# Patient Record
Sex: Male | Born: 2000 | Race: Black or African American | Hispanic: No | Marital: Single | Smoking: Never smoker
Health system: Southern US, Community
[De-identification: ages and names within clinical notes are randomized; demographics above are authoritative.]

## PROBLEM LIST (undated history)

## (undated) DIAGNOSIS — R61 Generalized hyperhidrosis: Secondary | ICD-10-CM

---
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
## 2020-10-18 ENCOUNTER — Encounter: Payer: Self-pay | Admitting: Family Medicine

## 2020-10-18 ENCOUNTER — Other Ambulatory Visit: Payer: Self-pay

## 2020-10-18 ENCOUNTER — Ambulatory Visit: Payer: Commercial Managed Care - PPO | Admitting: Family Medicine

## 2020-10-18 VITALS — BP 131/82 | HR 77 | Temp 97.8°F | Ht 71.0 in | Wt 150.0 lb

## 2020-10-18 DIAGNOSIS — R040 Epistaxis: Secondary | ICD-10-CM

## 2020-10-18 LAB — BASIC METABOLIC PANEL
BUN: 13 mg/dL (ref 7–20)
CO2: 32 mmol/L (ref 20–32)
Calcium: 10.3 mg/dL (ref 8.9–10.4)
Chloride: 101 mmol/L (ref 98–110)
Creat: 1.09 mg/dL (ref 0.60–1.26)
Glucose, Bld: 80 mg/dL (ref 65–139)
Potassium: 4.9 mmol/L (ref 3.8–5.1)
Sodium: 140 mmol/L (ref 135–146)

## 2020-10-18 LAB — HEPATIC FUNCTION PANEL
AG Ratio: 1.5 (calc) (ref 1.0–2.5)
ALT: 6 U/L — ABNORMAL LOW (ref 8–46)
AST: 14 U/L (ref 12–32)
Albumin: 4.7 g/dL (ref 3.6–5.1)
Alkaline phosphatase (APISO): 90 U/L (ref 46–169)
Bilirubin, Direct: 0.1 mg/dL (ref 0.0–0.2)
Globulin: 3.1 g/dL (calc) (ref 2.1–3.5)
Indirect Bilirubin: 0.3 mg/dL (calc) (ref 0.2–1.1)
Total Bilirubin: 0.4 mg/dL (ref 0.2–1.1)
Total Protein: 7.8 g/dL (ref 6.3–8.2)

## 2020-10-18 LAB — CBC WITH DIFFERENTIAL/PLATELET
Absolute Monocytes: 278 cells/uL (ref 200–950)
Basophils Absolute: 10 cells/uL (ref 0–200)
Basophils Relative: 0.2 %
Eosinophils Absolute: 72 cells/uL (ref 15–500)
Eosinophils Relative: 1.5 %
HCT: 42.7 % (ref 38.5–50.0)
Hemoglobin: 14.3 g/dL (ref 13.2–17.1)
Lymphs Abs: 1694 cells/uL (ref 850–3900)
MCH: 29.7 pg (ref 27.0–33.0)
MCHC: 33.5 g/dL (ref 32.0–36.0)
MCV: 88.8 fL (ref 80.0–100.0)
MPV: 9.3 fL (ref 7.5–12.5)
Monocytes Relative: 5.8 %
Neutro Abs: 2746 cells/uL (ref 1500–7800)
Neutrophils Relative %: 57.2 %
Platelets: 393 10*3/uL (ref 140–400)
RBC: 4.81 10*6/uL (ref 4.20–5.80)
RDW: 12.5 % (ref 11.0–15.0)
Total Lymphocyte: 35.3 %
WBC: 4.8 10*3/uL (ref 3.8–10.8)

## 2020-10-18 LAB — PROTIME-INR
INR: 1.2 — ABNORMAL HIGH
Prothrombin Time: 11.9 s — ABNORMAL HIGH (ref 9.0–11.5)

## 2020-10-18 MED ORDER — MUPIROCIN 2 % EX OINT: TOPICAL_OINTMENT | CUTANEOUS | 0 refills | Status: DC

## 2020-10-18 NOTE — Assessment & Plan Note (Signed)
Check cbc, bmp, hepatic panel and PT/INR.  Turbinates are inflamed, adding mupirocin bid x7 days.  Continue humidifier and nasal saline.  If continues and labs are normal we discussed referral to ENT.

## 2020-10-18 NOTE — Progress Notes (Signed)
Terrance Gonzalez - 20 y.o. male MRN [id number]  Date of birth: November 09, 2000  Subjective Chief Complaint  Patient presents with  . Epistaxis    HPI Buck Mcaffee is a 20 y.o. male here today with complaint of recurrent epistaxis.  Started having recurrent nosebleeds a couple of years ago.  He reports that now he is having every other week.  Worse during colder months.  Sometimes large amount of blood and just streaked in mucus at other times.  Denies nasal pain or headaches.  He has not noted any bleeding gums or easy bruising.  No family history of bleeding disorder.  He is using a humidifier and uses nasal saline intermittently.   ROS:  A comprehensive ROS was completed and negative except as noted per HPI  No Known Allergies  Past Medical History:  Diagnosis Date  . 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 No pertinent surgical history.  Social History   Socioeconomic History  . Marital status: Single    Spouse name: Not on file  . Number of children: Not on file  . Years of education: Not on file  . Highest education level: Not on file  Occupational History  . Not on file  Tobacco Use  . Smoking status: Never Smoker  . Smokeless tobacco: Never Used  Substance and Sexual Activity  . Alcohol use: Not on file  . Drug use: Not on file  . Sexual activity: Not on file  Other Topics Concern  . Not on file  Social History Narrative  . Not on file   Social Determinants of Health   Financial Resource Strain: Not on file  Food Insecurity: Not on file  Transportation Needs: Not on file  Physical Activity: Not on file  Stress: Not on file  Social Connections: Not on file    History reviewed. No pertinent family history.  Health Maintenance  Topic Date Due  . Hepatitis C Screening  Never done  . HPV VACCINES (1 - Male 2-dose series) Never done  . HIV Screening  Never done  . INFLUENZA VACCINE  Never done  . COVID-19 Vaccine (3 - Booster for Pfizer series) 02/13/2021   . TETANUS/TDAP  06/09/2021     ----------------------------------------------------------------------------------------------------------------------------------------------------------------------------------------------------------------- Physical Exam BP 131/82 (BP Location: Left Arm, Patient Position: Sitting, Cuff Size: Normal)   Pulse 77   Temp 97.8 F (36.6 C)   Ht 5\' 11"  (1.803 m)   Wt 150 lb (68 kg)   SpO2 100%   BMI 20.92 kg/m   Physical Exam Constitutional:      Appearance: Normal appearance.  HENT:     Nose:     Comments: Turbinates inflamed and erythematous. No mucosal thinning noted.  Eyes:     General: No scleral icterus. Cardiovascular:     Rate and Rhythm: Normal rate and regular rhythm.  Pulmonary:     Effort: Pulmonary effort is normal.     Breath sounds: Normal breath sounds.  Neurological:     General: No focal deficit present.     Mental Status: He is alert.  Psychiatric:        Mood and Affect: Mood normal.        Behavior: Behavior normal.     ------------------------------------------------------------------------------------------------------------------------------------------------------------------------------------------------------------------- Assessment and Plan  Recurrent epistaxis Check cbc, bmp, hepatic panel and PT/INR.  Turbinates are inflamed, adding mupirocin bid x7 days.  Continue humidifier and nasal saline.  If continues and labs are normal we discussed referral to ENT.   Meds ordered this  encounter  Medications  . mupirocin ointment (BACTROBAN) 2 %    Sig: Apply to inside of nares twice per day x7 days.    Dispense:  22 g    Refill:  0    No follow-ups on file.    This visit occurred during the SARS-CoV-2 public health emergency.  Safety protocols were in place, including screening questions prior to the visit, additional usage of staff PPE, and extensive cleaning of exam room while observing appropriate contact  time as indicated for disinfecting solutions.

## 2020-10-18 NOTE — Patient Instructions (Signed)
Nosebleed, Adult A nosebleed is when blood comes out of the nose. Nosebleeds are common and can be caused by many things. They are usually not a sign of a serious medical problem. Follow these instructions at home: When you have a nosebleed:  Sit down.  Tilt your head a little forward.  Follow these steps: 1. Pinch your nose with a clean towel or tissue. 2. Keep pinching your nose for 5 minutes. Do not let go. 3. After 5 minutes, let go of your nose. 4. If there is still bleeding, do these steps again. Keep doing these steps until the bleeding stops.  Do not put tissues or other things in your nose to stop the bleeding.  Avoid lying down or putting your head back.  Use a nose spray decongestant as told by your doctor.   After a nosebleed:  Try not to blow your nose or sniffle for several hours.  Try not to strain, lift, or bend at the waist for several days.  Aspirin and blood-thinning medicines make bleeding more likely. If you take these medicines: ? Ask your doctor if you should stop taking them or if you should change how much you take. ? Do not stop taking the medicine unless your doctor tells you to.  If your nosebleed was caused by dryness, use over-the-counter saline nasal spray or gel and humidifier as told by your doctor. This will keep the inside of your nose moist and allow it to heal. If you need to use one of these products: ? Choose one that is water-soluble. ? Use only as much as you need and use it only as often as needed. ? Do not lie down right away after you use it.  If you get nosebleeds often, talk with your doctor about treatments. These may include: ? Nasal cautery. A chemical swab or electrical device is used to lightly burn tiny blood vessels inside the nose. This helps stop or prevent nosebleeds. ? Nasal packing. A gauze or other material is placed in the nose to keep constant pressure on the bleeding area. Contact a doctor if:  You have a  fever.  You get nosebleeds often.  You are getting nosebleeds more often than usual.  You bruise very easily.  You have something stuck in your nose.  You have bleeding in your mouth.  You vomit or cough up brown material.  You get a nosebleed after you start a new medicine. Get help right away if:  You have a nosebleed after you fall or hurt your head.  Your nosebleed does not go away after 20 minutes.  You feel dizzy or weak.  You have unusual bleeding from other parts of your body.  You have unusual bruising on other parts of your body.  You get sweaty.  You vomit blood. Summary  Nosebleeds are common. They are usually not a sign of a serious medical problem.  When you have a nosebleed, sit down and tilt your head a little forward. Pinch your nose with a clean tissue for 5 minutes.  Use saline spray or saline gel and a humidifier as told by your doctor.  Get help right away if your nosebleed does not go away after 20 minutes. This information is not intended to replace advice given to you by your health care provider. Make sure you discuss any questions you have with your health care provider. Document Revised: 05/06/2019 Document Reviewed: 05/06/2019 Elsevier Patient Education  2021 [employer]  

## 2021-08-28 ENCOUNTER — Encounter: Payer: Self-pay | Admitting: Family Medicine

## 2021-08-28 ENCOUNTER — Telehealth: Payer: Self-pay

## 2021-08-28 NOTE — Telephone Encounter (Signed)
Patient's mother, Jasmine December, called the triage line to discuss the patient who tested positive for COVID on 08/26/2021 using at home test. He started experiencing symptoms on 08/25/2021 with nausea and vomiting. He has been experiencing  fevers, headaches, sore throat, and cough. Explained to mom the 5 day quarantine rules and treatment options for the patient. Using OTC meds (Dayquil and Nyquil), wearing a mask around the house, and returning to work procedures. She gave verbal understanding and stated the patient was feeling better.

## 2021-09-25 ENCOUNTER — Encounter: Payer: Self-pay | Admitting: Family Medicine

## 2021-09-25 ENCOUNTER — Ambulatory Visit: Payer: 59 | Admitting: Family Medicine

## 2021-09-25 ENCOUNTER — Other Ambulatory Visit: Payer: Self-pay

## 2021-09-25 DIAGNOSIS — R040 Epistaxis: Secondary | ICD-10-CM | POA: Diagnosis not present

## 2021-09-25 MED ORDER — SALINE SPRAY 0.65 % NA SOLN: 1.0000 | NASAL | 3 refills | Status: AC | PRN

## 2021-09-25 MED ORDER — MUPIROCIN 2 % EX OINT: TOPICAL_OINTMENT | CUTANEOUS | 0 refills | Status: AC

## 2021-09-25 NOTE — Assessment & Plan Note (Signed)
Adding mupirocin back on x7 days.  Continue nasal saline.  If this continues to be a recurrent issue will refer to ENT. ?

## 2021-09-25 NOTE — Patient Instructions (Signed)
Nosebleed, Adult A nosebleed is when blood comes out of the nose. Nosebleeds are common. Usually, they are not a sign of a serious condition. Nosebleeds can happen if a blood vessel in your nose starts to bleed or if the lining of your nose (mucous membrane) cracks. They are commonly caused by: Allergies. Colds. Picking your nose. Blowing your nose too hard. An injury from sticking an object into your nose or getting hit in the nose. Dry or cold air. Less common causes of nosebleeds include: Toxic fumes. Something abnormal in the nose or in the air-filled spaces in the bones of the face (sinuses). Growths in the nose, such as polyps. Blood thinners or conditions that cause blood to clot slowly. Certain illnesses or procedures that irritate or dry out the nasal passages. Follow these instructions at home: When you have a nosebleed:  Sit down and tilt your head slightly forward. Use a clean towel or tissue to pinch your nostrils under the bony part of your nose. After 5 minutes, let go of your nose and see if bleeding starts again. Do not release pressure before that time. If there is still bleeding, repeat the pinching and holding for 5 minutes or until the bleeding stops. Do not place tissues or gauze in the nose to stop the bleeding. Avoid lying down and avoid tilting your head backward. That may make blood collect in the throat and cause gagging or coughing. Use a nasal spray decongestant to help with a nosebleed as told by your health care provider. After a nosebleed: Avoid blowing your nose or sniffing for a number of hours. Avoid straining, lifting, or bending at the waist for several days. You may go back to other normal activities as you are able. If you are taking aspirin or blood thinners and you have nosebleeds, talk to your health care provider. These medicines make bleeding more likely. Ask your health care provider if you should stop taking the medicines or if you should  adjust the dose. Do not stop taking medicines that your health care provider has recommended unless he or she tells you to stop taking them. If your nosebleed was caused by dry mucous membranes, use over-the-counter saline nasal spray or gel and a humidifier as told by your health care provider. This will keep the mucous membranes moist and allow them to heal. If you need to use one of these products: Choose one that is water-soluble. Use only as much as you need and use it only as often as needed. Do not lie down right after you use it. If you get nosebleeds often, talk with your health care provider about medical treatments. Options may include: Nasal cautery. This treatment stops and prevents nosebleeds by using a chemical swab or electrical device to lightly burn tiny blood vessels inside the nose. Nasal packing. A gauze or other material is placed in the nose to keep constant pressure on the bleeding area. Contact a health care provider if you: Have a fever. Get nosebleeds often or more often than usual. Bruise very easily. Have a nosebleed from having something stuck in your nose. Have bleeding in your mouth. Vomit or cough up brown material. Have a nosebleed after you start a new medicine. Get help right away if: You have a nosebleed after a fall or a head injury. Your nosebleed does not go away after 20 minutes. You feel dizzy or weak. You have unusual bleeding from other parts of your body. You have unusual bruising on   other parts of your body. You become sweaty. You vomit blood. Summary A nosebleed is when blood comes out of the nose. Common causes include allergies, an injury to the nose, or cold or dry air. Initial treatment includes applying pressure for 5 minutes. Moisturizing the nose with saline nasal spray or gel after a nosebleed may help prevent future bleeding. Get help right away if your nosebleed does not go away after 20 minutes. This information is not intended  to replace advice given to you by your health care provider. Make sure you discuss any questions you have with your health care provider. Document Revised: 05/06/2019 Document Reviewed: 05/06/2019 Elsevier Patient Education  2022 [employer]  

## 2021-09-25 NOTE — Progress Notes (Signed)
?Terrance Gonzalez - 21 y.o. male MRN [id number]  Date of birth: 28-Feb-2001 ? ?Subjective ?Chief Complaint  ?Patient presents with  ? Follow-up  ? ? ?HPI ?Terrance Gonzalez is a 21-year-old male here today with complaint of recurrent nosebleeds.  He was seen a year ago for this as well.  Given mupirocin ointment and the symptoms resolved for several months with this.  Symptoms recently started a few weeks ago.  He has been unable to find mupirocin ointment.  He is using nasal saline with some improvement.  He has not had a nosebleed in approximate 1.5 weeks. ? ?ROS:  A comprehensive ROS was completed and negative except as noted per HPI ? ?No Known Allergies ? ?Past Medical History:  ?Diagnosis Date  ? 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 ? ?History reviewed. No pertinent surgical history. ? ?Social History  ? ?Socioeconomic History  ? Marital status: Single  ?  Spouse name: Not on file  ? Number of children: Not on file  ? Years of education: Not on file  ? Highest education level: Not on file  ?Occupational History  ? Not on file  ?Tobacco Use  ? Smoking status: Never  ? Smokeless tobacco: Never  ?Substance and Sexual Activity  ? Alcohol use: Not on file  ? Drug use: Not on file  ? Sexual activity: Not on file  ?Other Topics Concern  ? Not on file  ?Social History Narrative  ? Not on file  ? ?Social Determinants of Health  ? ?Financial Resource Strain: Not on file  ?Food Insecurity: Not on file  ?Transportation Needs: Not on file  ?Physical Activity: Not on file  ?Stress: Not on file  ?Social Connections: Not on file  ? ? ?History reviewed. No pertinent family history. ? ?Health Maintenance  ?Topic Date Due  ? HPV VACCINES (1 - Male 2-dose series) Never done  ? HIV Screening  Never done  ? Hepatitis C Screening  Never done  ? COVID-19 Vaccine (3 - Booster for Pfizer series) 10/11/2020  ? INFLUENZA VACCINE  Never done  ? TETANUS/TDAP  06/09/2021   ? ? ? ?----------------------------------------------------------------------------------------------------------------------------------------------------------------------------------------------------------------- ?Physical Exam ?BP 127/79 (BP Location: Left Arm, Patient Position: Sitting, Cuff Size: Normal)   Pulse 90   Ht 5\' 11"  (1.803 m)   Wt 153 lb (69.4 kg)   SpO2 98%   BMI 21.34 kg/m?  ? ?Physical Exam ?Constitutional:   ?   Appearance: Normal appearance.  ?HENT:  ?   Nose: Nose normal.  ?Cardiovascular:  ?   Rate and Rhythm: Normal rate and regular rhythm.  ?Pulmonary:  ?   Effort: Pulmonary effort is normal.  ?   Breath sounds: Normal breath sounds.  ?Musculoskeletal:  ?   Cervical back: Neck supple.  ?Neurological:  ?   Mental Status: He is alert.  ? ? ?------------------------------------------------------------------------------------------------------------------------------------------------------------------------------------------------------------------- ?Assessment and Plan ? ?Recurrent epistaxis ?Adding mupirocin back on x7 days.  Continue nasal saline.  If this continues to be a recurrent issue will refer to ENT. ? ? ?Meds ordered this encounter  ?Medications  ? mupirocin ointment (BACTROBAN) 2 %  ?  Sig: Apply to inside of nares twice per day x7 days.  ?  Dispense:  22 g  ?  Refill:  0  ? sodium chloride (OCEAN) 0.65 % SOLN nasal spray  ?  Sig: Place 1 spray into both nostrils as needed for congestion.  ?  Dispense:  15 mL  ?  Refill:  3  ? ? ?No follow-ups on file. ? ? ? ?  This visit occurred during the SARS-CoV-2 public health emergency.  Safety protocols were in place, including screening questions prior to the visit, additional usage of staff PPE, and extensive cleaning of exam room while observing appropriate contact time as indicated for disinfecting solutions.  ? ?
